# Patient Record
Sex: Female | Born: 2005 | Race: Black or African American | Hispanic: No | Marital: Single | Smoking: Never smoker
Health system: Southern US, Community
[De-identification: ages and names within clinical notes are randomized; demographics above are authoritative.]

---
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
## 2023-01-02 ENCOUNTER — Other Ambulatory Visit: Payer: Self-pay | Admitting: Pediatrics

## 2023-04-30 ENCOUNTER — Other Ambulatory Visit: Payer: Self-pay | Admitting: Pediatrics

## 2023-05-14 ENCOUNTER — Other Ambulatory Visit: Payer: Self-pay | Admitting: Pediatrics

## 2023-05-21 ENCOUNTER — Other Ambulatory Visit: Payer: Self-pay | Admitting: Pediatrics

## 2023-08-04 DIAGNOSIS — H5213 Myopia, bilateral: Secondary | ICD-10-CM | POA: Diagnosis not present

## 2023-11-11 ENCOUNTER — Other Ambulatory Visit: Payer: Self-pay | Admitting: Pediatrics

## 2024-02-12 DIAGNOSIS — T7840XA Allergy, unspecified, initial encounter: Secondary | ICD-10-CM | POA: Diagnosis not present

## 2024-02-12 DIAGNOSIS — Z0000 Encounter for general adult medical examination without abnormal findings: Secondary | ICD-10-CM | POA: Diagnosis not present

## 2024-02-12 DIAGNOSIS — L309 Dermatitis, unspecified: Secondary | ICD-10-CM | POA: Diagnosis not present

## 2024-02-12 DIAGNOSIS — Z23 Encounter for immunization: Secondary | ICD-10-CM | POA: Diagnosis not present

## 2024-02-12 DIAGNOSIS — Z13220 Encounter for screening for lipoid disorders: Secondary | ICD-10-CM | POA: Diagnosis not present

## 2024-02-12 DIAGNOSIS — J4520 Mild intermittent asthma, uncomplicated: Secondary | ICD-10-CM | POA: Diagnosis not present

## 2024-03-02 ENCOUNTER — Other Ambulatory Visit: Payer: Self-pay | Admitting: Pediatrics

## 2024-03-31 ENCOUNTER — Other Ambulatory Visit: Payer: Self-pay | Admitting: Pediatrics

## 2024-12-16 ENCOUNTER — Other Ambulatory Visit: Payer: Self-pay | Admitting: Pediatrics
